# Patient Record
Sex: Female | Born: 1960 | Race: White | Hispanic: No | Smoking: Never smoker
Health system: Southern US, Community
[De-identification: ages and names within clinical notes are randomized; demographics above are authoritative.]

## PROBLEM LIST (undated history)

## (undated) DIAGNOSIS — IMO0002 Reserved for concepts with insufficient information to code with codable children: Secondary | ICD-10-CM

## (undated) DIAGNOSIS — G4730 Sleep apnea, unspecified: Secondary | ICD-10-CM

## (undated) DIAGNOSIS — M069 Rheumatoid arthritis, unspecified: Secondary | ICD-10-CM

## (undated) DIAGNOSIS — F32A Depression, unspecified: Secondary | ICD-10-CM

## (undated) DIAGNOSIS — A630 Anogenital (venereal) warts: Secondary | ICD-10-CM

## (undated) DIAGNOSIS — F329 Major depressive disorder, single episode, unspecified: Secondary | ICD-10-CM

## (undated) DIAGNOSIS — T8859XA Other complications of anesthesia, initial encounter: Secondary | ICD-10-CM

## (undated) DIAGNOSIS — M810 Age-related osteoporosis without current pathological fracture: Secondary | ICD-10-CM

## (undated) DIAGNOSIS — K219 Gastro-esophageal reflux disease without esophagitis: Secondary | ICD-10-CM

## (undated) DIAGNOSIS — K2270 Barrett's esophagus without dysplasia: Secondary | ICD-10-CM

## (undated) DIAGNOSIS — M797 Fibromyalgia: Secondary | ICD-10-CM

## (undated) DIAGNOSIS — F419 Anxiety disorder, unspecified: Secondary | ICD-10-CM

## (undated) DIAGNOSIS — I10 Essential (primary) hypertension: Secondary | ICD-10-CM

## (undated) DIAGNOSIS — T4145XA Adverse effect of unspecified anesthetic, initial encounter: Secondary | ICD-10-CM

## (undated) DIAGNOSIS — K449 Diaphragmatic hernia without obstruction or gangrene: Secondary | ICD-10-CM

## (undated) HISTORY — PX: OTHER SURGICAL HISTORY: SHX169

## (undated) HISTORY — DX: Fibromyalgia: M79.7

## (undated) HISTORY — DX: Gastro-esophageal reflux disease without esophagitis: K21.9

## (undated) HISTORY — DX: Rheumatoid arthritis, unspecified: M06.9

## (undated) HISTORY — DX: Depression, unspecified: F32.A

## (undated) HISTORY — DX: Major depressive disorder, single episode, unspecified: F32.9

## (undated) HISTORY — DX: Diaphragmatic hernia without obstruction or gangrene: K44.9

## (undated) HISTORY — DX: Reserved for concepts with insufficient information to code with codable children: IMO0002

## (undated) HISTORY — DX: Anogenital (venereal) warts: A63.0

## (undated) HISTORY — PX: NASAL SINUS SURGERY: SHX719

## (undated) HISTORY — DX: Essential (primary) hypertension: I10

## (undated) HISTORY — DX: Age-related osteoporosis without current pathological fracture: M81.0

## (undated) HISTORY — DX: Anxiety disorder, unspecified: F41.9

## (undated) HISTORY — PX: COLPOSCOPY: SHX161

---

## 1980-07-08 HISTORY — PX: MANDIBLE SURGERY: SHX707

## 1993-07-08 HISTORY — PX: TOTAL HIP ARTHROPLASTY: SHX124

## 1994-07-08 HISTORY — PX: TOTAL HIP ARTHROPLASTY: SHX124

## 1996-07-08 HISTORY — PX: TOTAL SHOULDER ARTHROPLASTY: SHX126

## 1998-03-24 ENCOUNTER — Other Ambulatory Visit: Admission: RE | Admit: 1998-03-24 | Discharge: 1998-03-24 | Payer: Self-pay | Admitting: Gastroenterology

## 1998-03-28 ENCOUNTER — Ambulatory Visit (HOSPITAL_COMMUNITY): Admission: RE | Admit: 1998-03-28 | Discharge: 1998-03-28 | Payer: Self-pay | Admitting: Obstetrics and Gynecology

## 1998-07-04 ENCOUNTER — Other Ambulatory Visit: Admission: RE | Admit: 1998-07-04 | Discharge: 1998-07-04 | Payer: Self-pay | Admitting: Obstetrics and Gynecology

## 1999-05-25 ENCOUNTER — Other Ambulatory Visit: Admission: RE | Admit: 1999-05-25 | Discharge: 1999-05-25 | Payer: Self-pay | Admitting: *Deleted

## 1999-05-25 ENCOUNTER — Encounter (INDEPENDENT_AMBULATORY_CARE_PROVIDER_SITE_OTHER): Payer: Self-pay | Admitting: Specialist

## 2000-03-04 ENCOUNTER — Other Ambulatory Visit: Admission: RE | Admit: 2000-03-04 | Discharge: 2000-03-04 | Payer: Self-pay | Admitting: Obstetrics and Gynecology

## 2000-05-23 ENCOUNTER — Encounter (INDEPENDENT_AMBULATORY_CARE_PROVIDER_SITE_OTHER): Payer: Self-pay | Admitting: Specialist

## 2000-05-23 ENCOUNTER — Other Ambulatory Visit: Admission: RE | Admit: 2000-05-23 | Discharge: 2000-05-23 | Payer: Self-pay | Admitting: Gastroenterology

## 2000-06-17 ENCOUNTER — Encounter: Payer: Self-pay | Admitting: Orthopedic Surgery

## 2000-06-19 ENCOUNTER — Inpatient Hospital Stay (HOSPITAL_COMMUNITY): Admission: RE | Admit: 2000-06-19 | Discharge: 2000-06-20 | Payer: Self-pay | Admitting: Orthopedic Surgery

## 2001-05-27 ENCOUNTER — Encounter: Payer: Self-pay | Admitting: Obstetrics and Gynecology

## 2001-05-27 ENCOUNTER — Ambulatory Visit (HOSPITAL_COMMUNITY): Admission: RE | Admit: 2001-05-27 | Discharge: 2001-05-27 | Payer: Self-pay | Admitting: Obstetrics and Gynecology

## 2003-07-09 DIAGNOSIS — A630 Anogenital (venereal) warts: Secondary | ICD-10-CM

## 2003-07-09 HISTORY — DX: Anogenital (venereal) warts: A63.0

## 2003-07-19 ENCOUNTER — Other Ambulatory Visit: Admission: RE | Admit: 2003-07-19 | Discharge: 2003-07-19 | Payer: Self-pay | Admitting: Obstetrics and Gynecology

## 2003-11-11 ENCOUNTER — Other Ambulatory Visit: Admission: RE | Admit: 2003-11-11 | Discharge: 2003-11-11 | Payer: Self-pay | Admitting: Obstetrics and Gynecology

## 2004-03-19 ENCOUNTER — Ambulatory Visit (HOSPITAL_COMMUNITY): Admission: RE | Admit: 2004-03-19 | Discharge: 2004-03-19 | Payer: Self-pay | Admitting: Obstetrics and Gynecology

## 2004-05-10 ENCOUNTER — Other Ambulatory Visit: Admission: RE | Admit: 2004-05-10 | Discharge: 2004-05-10 | Payer: Self-pay | Admitting: Obstetrics and Gynecology

## 2004-05-11 ENCOUNTER — Ambulatory Visit: Payer: Self-pay | Admitting: Gastroenterology

## 2004-05-17 ENCOUNTER — Ambulatory Visit: Payer: Self-pay | Admitting: Gastroenterology

## 2004-12-06 ENCOUNTER — Other Ambulatory Visit: Admission: RE | Admit: 2004-12-06 | Discharge: 2004-12-06 | Payer: Self-pay | Admitting: Obstetrics and Gynecology

## 2005-10-22 ENCOUNTER — Ambulatory Visit: Payer: Self-pay | Admitting: Gastroenterology

## 2005-12-10 ENCOUNTER — Ambulatory Visit: Payer: Self-pay | Admitting: Gastroenterology

## 2005-12-17 ENCOUNTER — Ambulatory Visit: Payer: Self-pay | Admitting: Gastroenterology

## 2005-12-17 ENCOUNTER — Encounter (INDEPENDENT_AMBULATORY_CARE_PROVIDER_SITE_OTHER): Payer: Self-pay | Admitting: Specialist

## 2006-07-08 HISTORY — PX: OTHER SURGICAL HISTORY: SHX169

## 2006-08-25 ENCOUNTER — Encounter: Admission: RE | Admit: 2006-08-25 | Discharge: 2006-08-25 | Payer: Self-pay | Admitting: Orthopedic Surgery

## 2006-11-06 ENCOUNTER — Encounter: Admission: RE | Admit: 2006-11-06 | Discharge: 2006-11-06 | Payer: Self-pay | Admitting: Orthopedic Surgery

## 2007-01-28 ENCOUNTER — Inpatient Hospital Stay (HOSPITAL_COMMUNITY): Admission: RE | Admit: 2007-01-28 | Discharge: 2007-01-31 | Payer: Self-pay | Admitting: Orthopedic Surgery

## 2007-02-18 IMAGING — CT CT PELVIS W/O CM
2 of 4 series · 17 of 46 positions shown, 19 images · non-contrast
Comparison: None.

CLINICAL DATA: Status post bilateral hip replacement. Right hip pain. Question
fracture or loosening.
TECHNIQUE: Multidetector CT imaging of the pelvis was performed following the
standard protocol without IV contrast.

[Series 3: boney pelvis · axial · 0.70mm/px · z∈[-362,-62]mm · 14 of 128 slices shown, 16 images]
[im 8/128  soft-tissue]
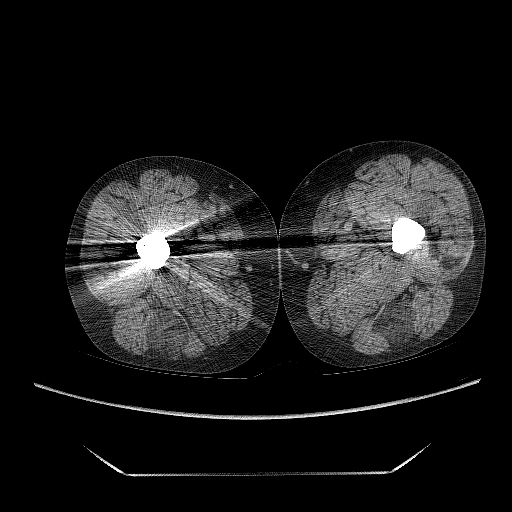
[im 8/128  bone]
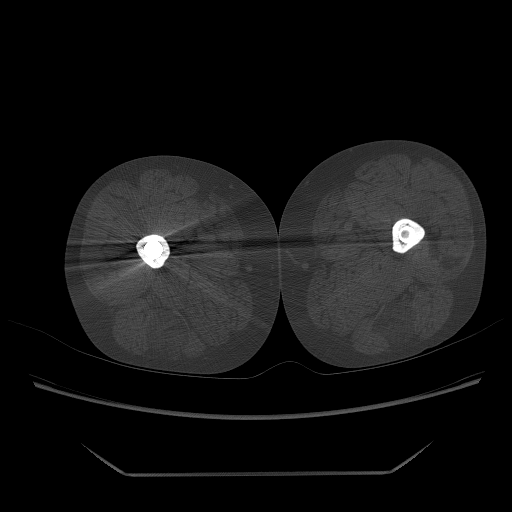
[im 15/128  soft-tissue]
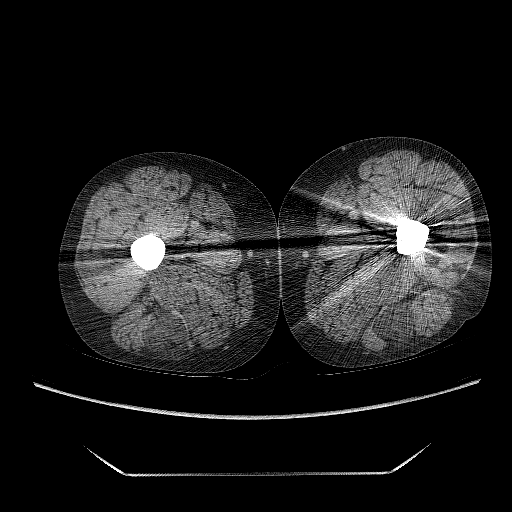
[im 23/128  soft-tissue]
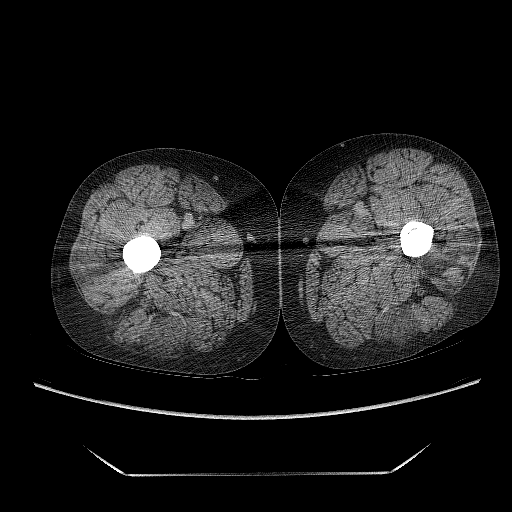
[im 38/128  soft-tissue]
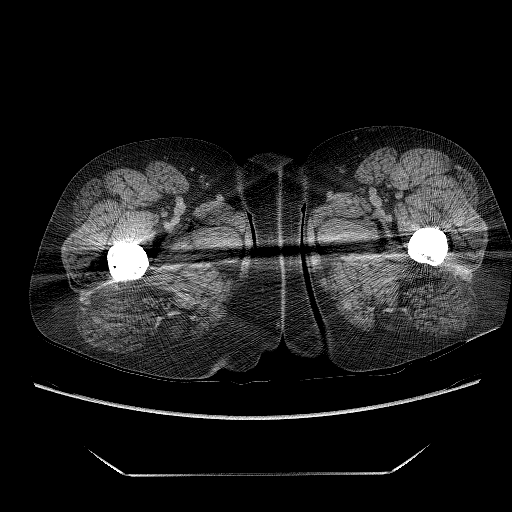
[im 45/128  soft-tissue]
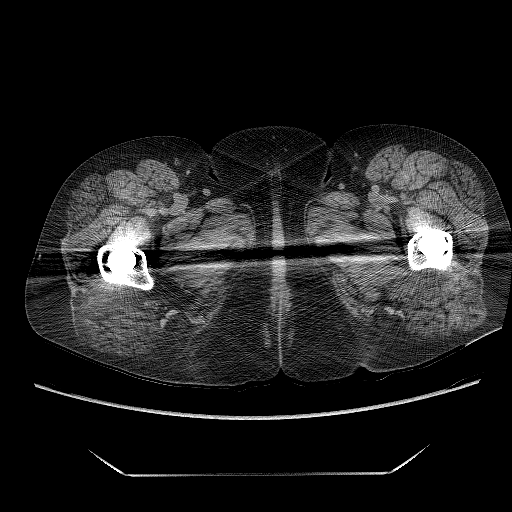
[im 53/128  soft-tissue]
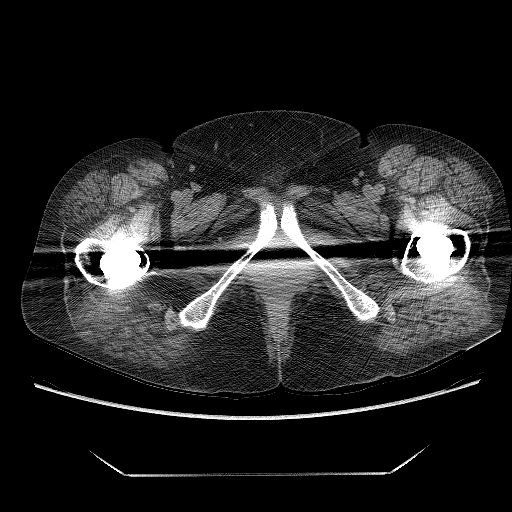
[im 60/128  soft-tissue]
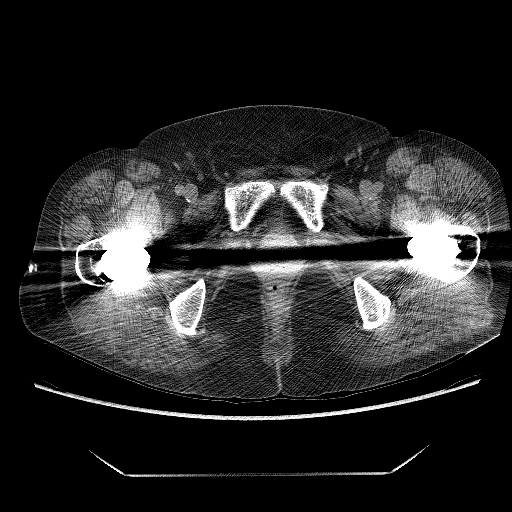
[im 68/128  soft-tissue]
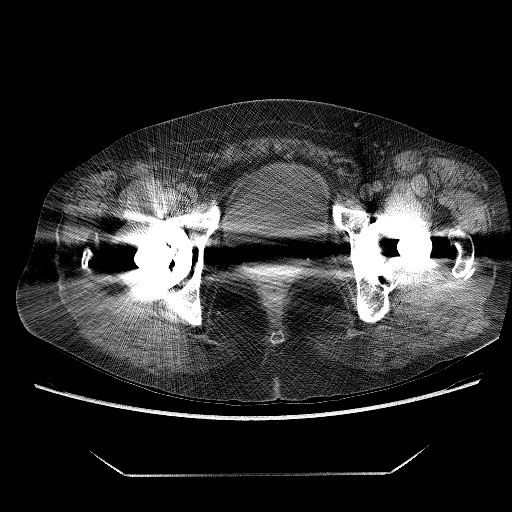
[im 75/128  soft-tissue]
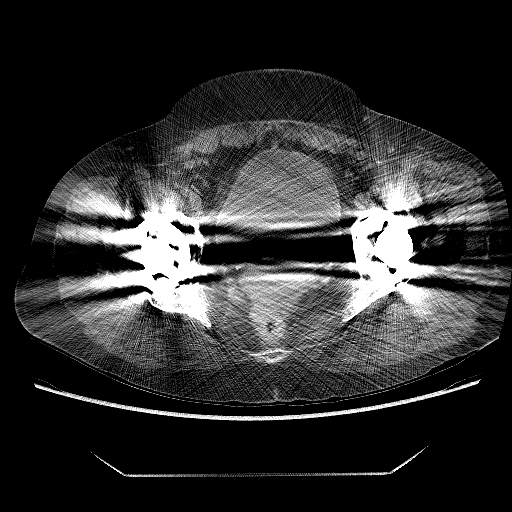
[im 75/128  bone]
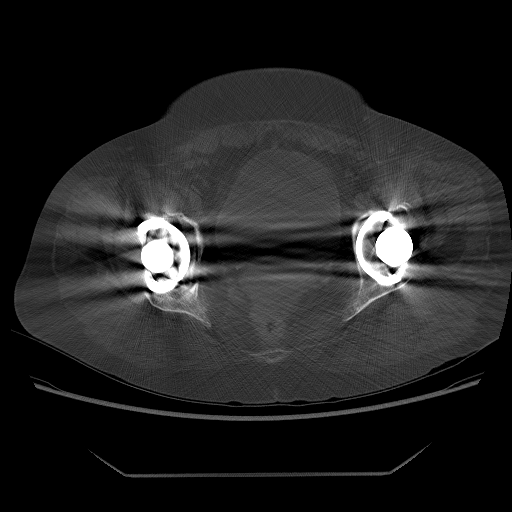
[im 83/128  soft-tissue]
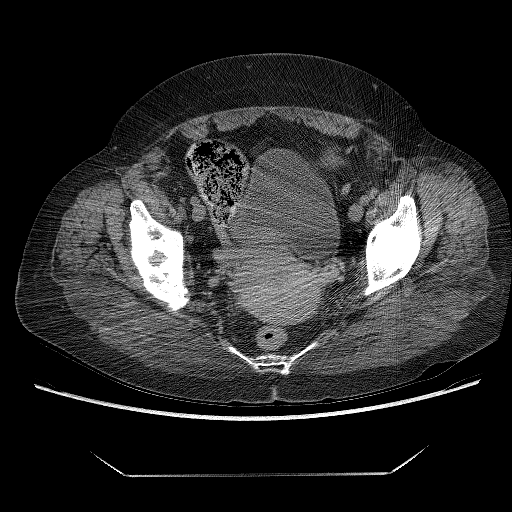
[im 98/128  soft-tissue]
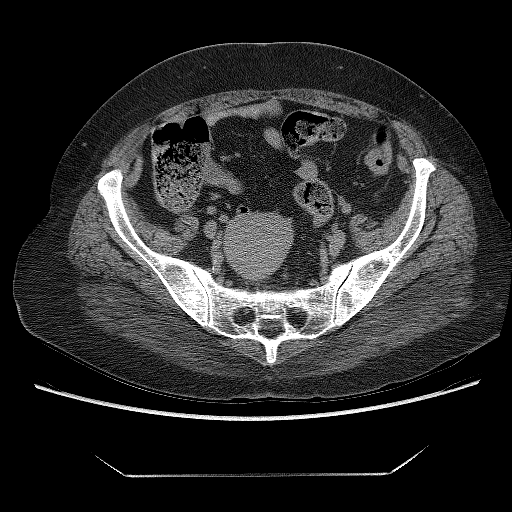
[im 105/128  soft-tissue]
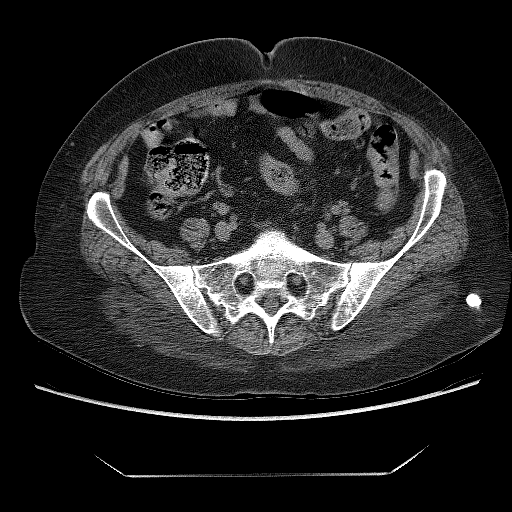
[im 113/128  soft-tissue]
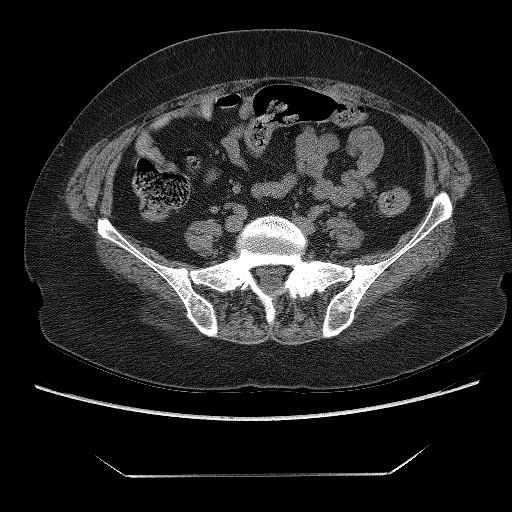
[im 120/128  soft-tissue]
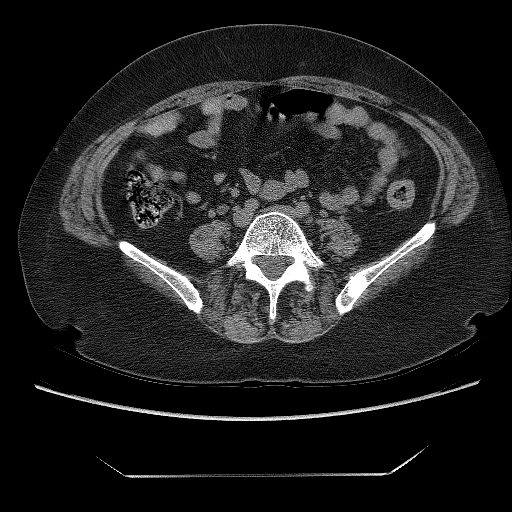

[Series 602: sagittal body · sagittal · 0.70mm/px · 3 of 142 slices shown]
[im 48/142  soft-tissue]
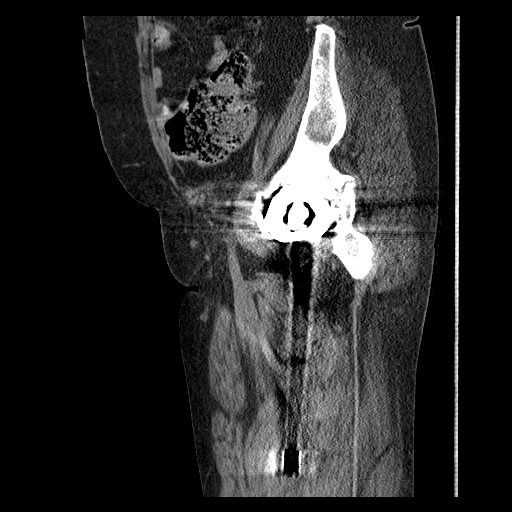
[im 63/142  soft-tissue]
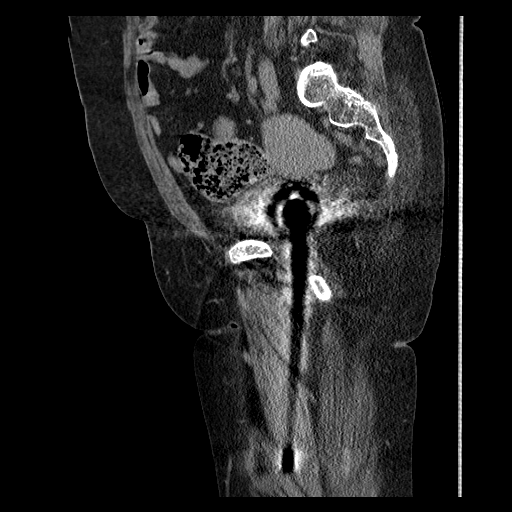
[im 79/142  soft-tissue]
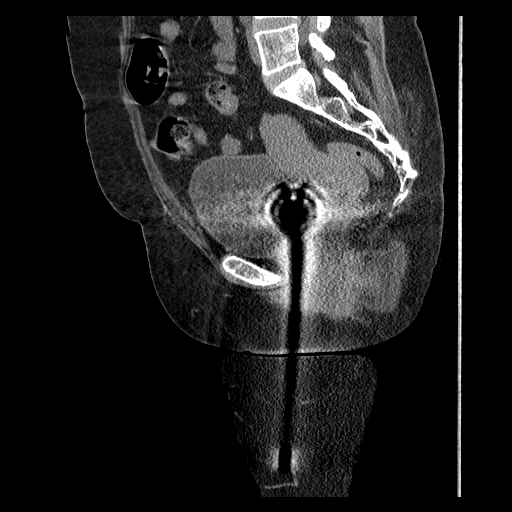

[17 of 46 positions shown; findings below may reference images not displayed]

CT PELVIS WITHOUT CONTRAST:  

1.9 cm well-defined sclerotic focus in the left iliac crest is probably a bone
island, demonstrating no cortical thickening or bony expansion.

The patient has a right hip prosthesis in place. There is trabecular loss and
lucency along the superior margin of the acetabular component, and also along
the antero- inferior aspect of the acetabular cup. Imaging features may be
related to small particle disease or infection. Loosening could also have this
appearance. Although streak artifact from the hardware degrades image quality at
the level of the acetabulum, there is a fracture of the right acetabulum
extending from the posterosuperior acetabulum anteriorly and inferiorly.

No evidence for a periprosthetic fracture involving the femoral component.

The pubic rami are intact. No evidence for sacral insufficiency fracture.
IMPRESSION: There is lucency around the right acetabular cup, mainly antero- inferiorly
although cystic change is also seen in the roof of acetabulum. This appearance
is probably related to small particle disease or loosening although infection
cannot be excluded.

There is an associated fracture of the right acetabulum with some remodeling
along the posterior superior aspect of the fracture line suggesting that this is
nonacute. Fracture line is apparent at the posterior and superior aspect of the
acetabulum, in the superior aspect of the anterior acetabulum, and extending
antero- inferiorly through the innominate bone.

## 2010-07-29 ENCOUNTER — Encounter: Payer: Self-pay | Admitting: Obstetrics and Gynecology

## 2010-11-20 NOTE — H&P (Signed)
NAMEJHOANNA, Jill Rasmussen               ACCOUNT NO.:  [id number]   MEDICAL RECORD NO.:  [id number]          PATIENT TYPE:  INP   LOCATION:  1606                         FACILITY:  [facility]   PHYSICIAN:  Ollen Gross, M.D.    DATE OF BIRTH:  05-04-1961   DATE OF ADMISSION:  01/28/2007  DATE OF DISCHARGE:                              HISTORY & PHYSICAL   CHIEF COMPLAINT:  Right hip pain.   HISTORY OF PRESENT ILLNESS:  The patient is 50 year old female who has  been seen by Dr. Lequita Halt for ongoing hip pain.  She was referred over by  Dr. Simonne Come for painful right total hip.  She had her hip replaced  back in the mid-90s.  Did well up until this past year.  Recently over  the past several months she has developed significant groin pain with  ambulation.  She has been evaluated and discovered that she had a medial  wall acetabular fracture.  She has had a couple of CT scans which showed  some healing of that but also showed loosening behind the in the  acetabular component.  Her pain is with weightbearing.  She has been  seen by Dr. Lequita Halt and felt based on clinical presentation and based on  her x-rays, it appears that she has a loose acetabular component.  It  was felt that she would benefit from undergoing surgical intervention.  The risks and benefits have been discussed, and she elects to proceed  with surgery.   ALLERGIES:  NO KNOWN DRUG ALLERGIES.   CURRENT MEDICATIONS:  Prednisone, Prilosec, Benicar, Lyrica., Effexor  XR, Fosamax, hydrocodone.   PAST MEDICAL HISTORY:  1. Depression.  2. Migraines.  3. History of pneumonia in 1994.  4. Hypertension.  5. Hiatal hernia.  6. Reflux.  7. Osteoporosis.  8. Rheumatoid arthritis.   PAST SURGERIES:  1. History of upper jaw surgery in 1984, lower jaw surgery in 1985.  2. Left hip replaced in 1994.  3. Right knee replaced in 1995.  4. Left shoulder replaced in 1997.  5. Left foot surgery in 2001.   SOCIAL HISTORY:  Divorced.   Teacher.  Nonsmoker.  Social intake of  alcohol.  One daughter.  Friend and daughter will be assisting with care  after surgery.   FAMILY HISTORY:  Arthritis, diabetes.   REVIEW OF SYSTEMS:  GENERAL:  No fevers, chills, night sweats.  NEUROLOGIC:  No seizures, syncope, or paralysis.  RESPIRATORY:  No  shortness of breath, productive cough, or hemoptysis.  CARDIOVASCULAR:  No chest pain, angina, orthopnea.  GI:  No nausea, vomiting, diarrhea,  or constipation.  GU:  No dysuria, hematuria, or discharge.  MUSCULOSKELETAL:  Right hip.   PHYSICAL EXAMINATION:  VITAL SIGNS:  Pulse 78, respirations 14, blood  pressure 124/84.  GENERAL:  50 year old white female, well-nourished, well-developed,  short stature, slightly overweight, alert, oriented, cooperative,  pleasant.  HEENT:  Normocephalic, atraumatic.  Pupils are round and reactive.  Oropharynx clear.  EOMs intact.  NECK:  Supple.  CHEST:  Clear anterior posterior chest walls.  No rhonchi, rales,  wheezing.  HEART:  Regular rate and rhythm.  No murmur.  S1 AND S2.  ABDOMEN:  Soft, slightly round.  Bowel sounds present.  BREASTS/GU:  Not done.  Not pertinent to present illness.  EXTREMITIES:  Right hip shows flexion of 100 degrees, internal rotation  20, external rotation 25, abduction 30.   IMPRESSION:  1. Loose acetabular component, right total hip.  2. Migraines.  3. Depression.  4. History of pneumonia in 1994.  5. Hypertension.  6. Hiatal hernia.  7. Reflux disease.  8. Osteoporosis.  9. Rheumatoid arthritis.   PLAN:  The patient will be admitted to [facility] to undergo a  right acetabular revision versus total hip arthroplasty revision.  Surgery will be performed by Dr. Ollen Gross.      Alexzandrew L. Perkins, P.A.C.      Ollen Gross, M.D.  Electronically Signed    ALP/MEDQ  D:  01/29/2007  T:  01/30/2007  Job:  [id number]   cc:   Areatha Keas, M.D.  Fax: [phone]   Ollen Gross, M.D.  Fax:  [phone]

## 2010-11-20 NOTE — Op Note (Signed)
NAMEKEITA, DEMARCO               ACCOUNT NO.:  [id number]   MEDICAL RECORD NO.:  [id number]          PATIENT TYPE:  INP   LOCATION:  1606                         [facility]ACILITY:  [facility]   PHYSICIAN:  Ollen Gross, M.D.    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   PROCEDURE:  Right acetabular revision.   SURGEON:  Ollen Gross, M.D.   ASSISTANT:  Alexzandrew L. Perkins, P.A.C.   ANESTHESIA:  Spinal.   ESTIMATED BLOOD LOSS:  200 mL.   DRAINS:  None.   COMPLICATIONS:  None.   CONDITION:  Stable to recovery.   CLINICAL NOTE:  Jill Rasmussen is a 50 year old female with right total hip  arthroplasty done many years ago by Dr. Simonne Come.  She has done well  until the past year or so when she has had progressively worsening groin  pain.  She had a medial wall acetabular fracture at some point.  The  bone scan was consistent with activity around the acetabular component.  Overall, clinically, it appears that the acetabular component has  loosened.  She presents now for acetabular revision.   PROCEDURE IN DETAIL:  After successful administration of spinal  anesthetic, the patient is placed in the left lateral decubitus position  with the right side up and held with the hip positioner.  The right  lower extremity was isolated from her perineum with plastic drapes and  prepped and draped in the usual sterile fashion.  The proximal half of  her previous incisions were utilized, the skin cut with a 10 blade  through subcutaneous tissue, to the fascia lata which was incised in  line with the skin incision.  The sciatic nerve was palpated and  protected.  The posterior pseudocapsule was excised off the femur.  The  fibrinous scar  tissue was removed.  There was fibrinous debris  consistent with osteolytic debris in the joint.  No fluid was  encountered.  I removed the pseudomembrane and the hip was easily  dislocated.  She had a ceramic head which is removed.  I removed some of  the anterior scar and was able to translate the femur anteriorly and get  excellent acetabular exposure.  The component had an unusual appearance  in that the elevated liner was in the anterior inferior position which  we would not expect it to be with the initial prosthesis.  When I went  to remove the liner, entire cup was loose and the cup and liner came out  in one unit.  This was is the Dual Geometry cup which had no porous  coating and had HA coating initially.  There was absolutely no ingrowth.  The cup had loosened and then spun out inferiorly.  The anterior and  posterior columns were intact.  Medially, the bone was thin but intact.  I removed all the fibrinous membrane from  the base of the acetabulum to  get down to bone.  The component was a 52 mm component.  I began reaming  at 49 mm and reamed all the way up to 59 mm in order to get a good rim  fit.  A 16 mm Pinnacle multi-fold acetabular component is impacted into  the acetabulum with good purchase and then is transfixed with an  additional four dome screws with excellent purchase.  I reattached the  impactor handle to the cup and moved the cup around and cup and pelvis  were moving as a single unit consistent with a stable implant.  We  placed the trial 36 mm neutral +4 liner.  The Osteonics stem was in  excellent position and was well fixed.  I tried a 36 +0 head first and  there was a little too much soft tissue laxity, so I put a 36 +5 which  had excellent soft tissue tension.  The stability was outstanding with  full extension, full external rotation, 70 degrees flexion, 40 degrees  adduction, 90 degrees internal rotation, 90 degrees of flexion, and  about 70 degrees of  internal rotation.  I placed the right leg on top of  the left, it felt as though the leg lengths were equal.  We then removed  the trial head and liner and placed the permanent 36 mm neutral +4  Marathon liner into the acetabular component and then placed the 36 +5  femoral head.  This was a C taper Osteonics femoral head.  The  acetabular component is a Pinnacle acetabular component from [employer].  Once the hip was reduced, there was outstanding stability, again.  The  wound was then copiously irrigated with saline solution and the  posterior tissues reattached to the femur through drill holes with  Ethibond suture.  The fascia lata was closed with interrupted #1 Vicryl,  subcu closed with #1 and 2-0 Vicryl, and the skin closed with staples.  The incision was cleaned and dried.  A bulky sterile dressing was  applied.  The leg was placed into a knee immobilizer and she was  awakened and transferred to recovery in stable condition.      Ollen Gross, M.D.  Electronically Signed     FA/MEDQ  D:  01/28/2007  T:  01/28/2007  Job:  [id number]

## 2010-11-23 NOTE — Discharge Summary (Signed)
[facility]  Patient:    Jill Rasmussen, Jill Rasmussen                     MRN: [id number] Adm. Date:  [id number] Disc. Date: [id number] Attending:  Marlowe Kays Page Dictator:   Della Goo, P.A.                           Discharge Summary  ADMITTING DIAGNOSES: 1. Rheumatoid arthritis multiple deformities of the left foot. 2. History of hiatal hernia and gastroesophageal reflux disease.  DISCHARGE DIAGNOSES: 1. Rheumatoid arthritis multiple deformities of the left foot. 2. History of hiatal hernia and gastroesophageal reflux disease.  PROCEDURE:  The patient underwent a bunionectomy fusion metatarsal phalangeal joint of the great toe on the left as well as fusion proximal interphalangeal joint second toe. Resection of metatarsal heads 2-5 with pin fixation of the left foot was also performed by Dr. Simonne Come assisted by Maud Deed, P.A.-C. under general anesthesia.  CONSULTATIONS:  None.  BRIEF HISTORY:  Jill Rasmussen is a 50 year old white female with known rheumatoid arthritis. She has significant bunion deformity with severe valgus of the great toe associated with clawing of the second and third toes and slightly of the fifth toe with prominent painful metatarsal heads and callouses. It was felt she would require surgical intervention and was admitted for the procedure as stated above.  BRIEF HOSPITAL COURSE:  The patient tolerated the procedure without difficulty. Unfortunately, postoperatively she had significant discomfort and she also had known sensitivity to morphine and Demerol both of which cause her severe nausea and vomiting postoperatively. She eventually was placed on a Dilaudid PCA pump and Phenergan scheduled and for the first 24 hours did have better pain control. Eventually, she did become quite nauseated and had nausea and vomiting throughout the entire second postoperative day. Dilaudid was discontinued and she was started on  Toradol for pain control. She was also given IV Reglan and Phenergan for nausea. Her IV rate was increased and she was changed to clear liquids until the nausea resolved. The patient received a perioperative dose of steroids as she is a chronic steroid user. She developed hypokalemia at 2.7 on December 13 and was treated with K-Dur. Prior to discharge, her hypokalemia resolved. The patient had a physical therapy consult for ambulation and gait training and was allowed touchdown weightbearing only on the left lower extremity. She utilized crutches for ambulation without difficulty. The dressing was found to be intact. Neuromuscular motor function in the lower extremities was intact. On June 20, 2000, the patient was felt to be stable for discharge to her home.  PERTINENT LABORATORY DATA:  As stated above, the patient had hypokalemia which resolved. The patient also had very mild hyponatremia which resolved. Hemoglobin and hematocrit on June 17, 2000 showed values of 12.6 and 36.3 respectively. No chest x-ray or EKG is on the chart for this dictation.  PLAN:  The patient is discharged to home. She is advised to continue strict elevation of the foot when not ambulating. She will be touchdown weightbearing utilizing crutches. The patient will keep her dressing/splint dry and clean at all times. She will use over the counter laxatives or stool softener as needed as long as she is on analgesics. The patient will follow-up with Dr. Simonne Come 10-14 days following her surgery. Prescriptions at discharge include Percocet 5 mg #40, 1-2 every 4-6h as needed for pain, Robaxin 500 mg #30, 1  every 8h as needed for spasm, Phenergan 25 mg #10, 1 every 6h as needed for nausea and vomiting. The patient has been advised to call the office if she has further questions or concerns prior to her return office visit. DD:  06/20/00 TD:  06/20/00 Job: [id number]

## 2010-11-23 NOTE — Discharge Summary (Signed)
NAMEENZLEY, KITCHENS               ACCOUNT NO.:  [id number]   MEDICAL RECORD NO.:  [id number]          PATIENT TYPE:  INP   LOCATION:  1606                         FACILITY:  [facility]   PHYSICIAN:  Ollen Gross, M.D.    DATE OF BIRTH:  07-Jun-1961   DATE OF ADMISSION:  01/28/2007  DATE OF DISCHARGE:  01/31/2007                               DISCHARGE SUMMARY   ADMITTING DIAGNOSES:  1. Loose acetabular component, right total hip.  2. Migraines.  3. Depression.  4. History of pneumonia in 1994.  5. Hypertension.  6. Hiatal hernia.  7. Reflux disease.  8. Osteoporosis.  9. Rheumatoid arthritis.   DISCHARGE DIAGNOSES:  1. Failed acetabular component, right total hip, status post right      acetabular revision.  2. Mild postop blood loss anemia.  3. Migraines.  4. Depression.  5. History of pneumonia in 1994.  6. Hypertension.  7. Hiatal hernia.  8. Reflux disease.  9. Osteoporosis.  10.Rheumatoid arthritis.   PROCEDURE:  January 28, 2007, right acetabular revision.  Surgeon Dr.  Lequita Halt.  Assistant Avel Peace, PA-C.  Spinal anesthesia.   CONSULTS:  None.   BRIEF HISTORY:  Jill Rasmussen is a 50 year old female with a right total hip  done many years ago by Dr. Simonne Come.  Did well until about a year or so  ago.  Had progressive worsening groin pain and a median wall acetabular  fracture.  Bone scan showed some activity consistent around the  acetabular component.  It appeared that it had loosened up.  Now  presents for the revision surgery.   LABORATORY DATA:  Preop CBC showed a hemoglobin of 12.3, hematocrit of  36.7, white cell count 8.7.  Postop hemoglobin 9.9, drifted down to 9.4,  back up to 9.7 and 27.8 crit.  PT/PTT preop 12.6 and 27 respectively.  INR 0.9.  Serial protimes __________ PT/INR of 13.3 and 2.8.  Chem panel  on admission all within normal limits with exception of minimally low  total bili of 0.2.  Serial BMETs were followed.  Electrolytes remained  within normal  limits.  Urine pregnancy preop negative.  Preop UA  negative.  Blood group type O+.  EKG January 23, 2007, normal sinus rhythm,  normal EKG, no previous tracings, confirmed by Dr. Juanito Doom.  Two-view  chest January 23, 2007, no acute cardiopulmonary disease.  Right hip films  preop:  Loosening of acetabular component, right total hip, with  fracture median wall of the right acetabular.  Finding consistent with  wear as the acetabular component, left total hip.  Portable pelvis:  Internal revision of right total hip acetabular component without  complicating features, expected postsurgical appearance.   HOSPITAL COURSE:  The patient admitted to [facility],  tolerated procedure well.  Later sent to the recovery room, orthopedic  floor.  Started on PCA and p.o. analgesics for pain control following  surgery.  Actually did well through the morning of day one.  On the  evening of surgery, in the morning of day one, a little asymptomatic  hypotension noted.  She was preop at  124 systolic, was down to 94, so we  discontinued the PCA.  It was felt to be due to narcotics and  postsurgical.  She had excellent urinary output post surgery on day one.  Dressing looked okay.  Hemoglobin was down to 9.9.  By day two,  hemoglobin was a little bit lower at 9.4.  She was actually feeling  better.  Started getting up with her physical therapy and walked about  35 feet.  She was doing very well, having undergone acetabular revision.  The INR had come back up and she was already therapeutic at 2.5.  She  continued to progress well.  Hemoglobin was back up to 9.7, on the  increase.  She was tolerating her meds, ambulating well, and was ready  go home by the day of January 31, 2007.   DISCHARGE/PLAN:  1. The patient discharged home on January 31, 2007.  2. Discharge diagnoses, please see above.  3. Discharge meds:  Coumadin, Percocet, Robaxin.  4. Activity:  She is partial weightbearing, 25-50% right lower       extremity, hip precautions, total hip protocol.  Home on PT and      home health nursing.  5. Follow-up 2 weeks.  6. Diet:  Regular diet.   DISPOSITION:  Home.   CONDITION ON DISCHARGE:  Improving.      Alexzandrew L. Perkins, P.A.C.      Ollen Gross, M.D.  Electronically Signed    ALP/MEDQ  D:  02/05/2007  T:  02/06/2007  Job:  [id number]   cc:   Areatha Keas, M.D.  Fax: [phone]

## 2010-11-23 NOTE — Op Note (Signed)
[facility]  Patient:    Jill Rasmussen, Jill Rasmussen                     MRN: [id number] Proc. Date: 06/17/00 Adm. Date:  [id number] Attending:  Marlowe Kays Page                           Operative Report  PREOPERATIVE DIAGNOSIS:  Rheumatoid arthritis with multiple deformities left foot.  POSTOPERATIVE DIAGNOSIS: Rheumatoid arthritis with multiple deformities left foot.  OPERATION: 1. Bunionectomy and fusion metatarsal phalangeal joint great toe. 2. Fusion proximal interphalangeal joint second toe. 3. Resection of metatarsal heads two through five with pin fixation, all left    foot.  SURGEON:  Illene Labrador. [place], M.D.  ASSISTANT:  Della Goo, P.A.  ANESTHESIA:  General.  JUSTIFICATION FOR PROCEDURE:  She had significant bunion deformity with severe valgus of the great toe associated with clawing of the second and thirds toes and slightly of the fifth toe and prominent painful metatarsal heads and calluses.  PROCEDURE:  Prophylactic antibiotics, satisfactory ______ anesthesia, pneumatic tourniquet ______ the foot and ankle was draped in a sterile field. I made incisions between the second and third and between the fourth and fifth metatarsals and in each case, was able to isolate the metatarsal head and neck and make an oblique cut lateral-ward so that it was longer medially and removed the metatarsal head.  The third and fifth toes, I was able to correct the PIP deformities with manual ______ but I could not in the second and consequently extended my incision between the second and third metatarsals over the outer side of the PIP joint of the second toe and after splitting the extensor mechanism resected the ends of the distal portion of the proximal phalanx and the proximal portion of the middle phalanx with the microsaw so that they were not as a nice squared off fashion.  I then made a dorsomedial typical bunion incision, but extended it  more distally over the proximal phalanx of the great toe.  The capsule was opened in line with skin incision and a generous bunionectomy made with osteotome and rongeur.  I denuded what was left of the articular cartilage of the first metatarsal head and made a receptacle at the base of the proximal phalanx with a combination of curet and the Mariam cone.  I then used the other mate to the Mariam cone to cone down the first metatarsal head.  I then used the C-arm and protractor to stabilize the first metatarsal phalangeal joint in about 15 degrees of valgus and about 25 degrees of dorsiflexion with two 0.045 smooth K-wires with position confirmed with the C-arm.  After this joint had been stabilized and I felt comfortable with the position, I used a 2.5 drill going through the medial flare of the proximal phalanx of the great toe into the metatarsal head and used the 4.0/35 mm cancellous screw stabilizing the fusion.  Final confirmatory pictures were taken with the C-arm.  I then placed some cancellous bone graft around the fusion site and after irrigating the wound well with antibiotic solution, then closed the capsule of the bunion with 2-0 Vicryl and skin with 4-0 nylon.  The metatarsals two through five and adjacent toes were stabilized with pin fixation.  I then released the placed retrograde.  I then released the tourniquet and made sure that all toes were nice and pink.  The subcutaneous tissue of the two incisions were closed with interrupted 3-0 Vicryl and skin with interrupted simple and mattress 4-0 nylon.  Betadine, Adaptic, dry sterile dressing were applied after the pins were bent, cut and pin protectors placed.  She tolerated the procedure well at the time of this dictation, she was on her way to recovery in satisfactory condition with no known complications. DD:  06/17/00 TD:  06/17/00 Job: [id number]

## 2011-04-22 LAB — PROTIME-INR
INR: 1.2
INR: 2.8 — ABNORMAL HIGH
Prothrombin Time: 12.6
Prothrombin Time: 28.4 — ABNORMAL HIGH
Prothrombin Time: 31.3 — ABNORMAL HIGH

## 2011-04-22 LAB — COMPREHENSIVE METABOLIC PANEL
BUN: 7
CO2: 28
Calcium: 9.6
Creatinine, Ser: 0.62
GFR calc non Af Amer: >60
Glucose, Bld: 96
Total Bilirubin: 0.2 — ABNORMAL LOW

## 2011-04-22 LAB — BASIC METABOLIC PANEL
BUN: 4 — ABNORMAL LOW
CO2: 25
Calcium: 7.9 — ABNORMAL LOW
Chloride: 105
Chloride: 105
Creatinine, Ser: 0.48
Creatinine, Ser: 0.63
Glucose, Bld: 121 — ABNORMAL HIGH
Glucose, Bld: 176 — ABNORMAL HIGH
Potassium: 3.6
Sodium: 137

## 2011-04-22 LAB — CBC
HCT: 26.7 — ABNORMAL LOW
HCT: 36.7
Hemoglobin: 12.3
Hemoglobin: 9.9 — ABNORMAL LOW
MCHC: 33.5
MCHC: 34.6
MCHC: 34.9
MCHC: 35.2
MCV: 90
MCV: 90.6
MCV: 91
MCV: 91.3
Platelets: 236
Platelets: 265
RBC: 3.09 — ABNORMAL LOW
RBC: 3.17 — ABNORMAL LOW
RBC: 4.02
RDW: 13.1
RDW: 13.2
RDW: 13.3
WBC: 13.1 — ABNORMAL HIGH

## 2011-04-22 LAB — CROSSMATCH: ABO/RH(D): O POS

## 2011-04-22 LAB — APTT: aPTT: 27

## 2011-04-22 LAB — URINALYSIS, ROUTINE W REFLEX MICROSCOPIC
Bilirubin Urine: NEGATIVE
Glucose, UA: NEGATIVE
Hgb urine dipstick: NEGATIVE
Ketones, ur: NEGATIVE
Nitrite: NEGATIVE
pH: 6

## 2012-06-29 ENCOUNTER — Other Ambulatory Visit: Payer: Self-pay | Admitting: Obstetrics and Gynecology

## 2012-06-29 DIAGNOSIS — Z1231 Encounter for screening mammogram for malignant neoplasm of breast: Secondary | ICD-10-CM

## 2012-07-15 ENCOUNTER — Ambulatory Visit (HOSPITAL_COMMUNITY): Payer: Self-pay | Attending: Obstetrics and Gynecology

## 2012-10-26 ENCOUNTER — Encounter: Payer: Self-pay | Admitting: *Deleted

## 2012-11-04 ENCOUNTER — Telehealth: Payer: Self-pay | Admitting: Obstetrics and Gynecology

## 2012-11-04 NOTE — Telephone Encounter (Signed)
Pt calling to get refill on her estrogen patch and progesterone sent to [facility] at [phone]. Scheduled aex for 5/12 with dr Edward Jolly. Would like enough sent until she can come in because she is out.

## 2012-11-16 ENCOUNTER — Encounter: Payer: Self-pay | Admitting: Obstetrics and Gynecology

## 2012-11-16 ENCOUNTER — Ambulatory Visit: Payer: Self-pay | Admitting: Obstetrics and Gynecology

## 2012-12-04 ENCOUNTER — Encounter: Payer: Self-pay | Admitting: Gynecology

## 2012-12-04 ENCOUNTER — Ambulatory Visit: Payer: Self-pay | Admitting: Gynecology

## 2012-12-04 DIAGNOSIS — Z01419 Encounter for gynecological examination (general) (routine) without abnormal findings: Secondary | ICD-10-CM

## 2013-12-24 ENCOUNTER — Encounter: Payer: Self-pay | Admitting: Gastroenterology

## 2014-02-15 ENCOUNTER — Encounter (HOSPITAL_COMMUNITY): Payer: Self-pay | Admitting: Pharmacy Technician

## 2014-02-21 ENCOUNTER — Encounter (HOSPITAL_COMMUNITY): Payer: Self-pay | Admitting: *Deleted

## 2014-03-02 ENCOUNTER — Other Ambulatory Visit: Payer: Self-pay | Admitting: Gastroenterology

## 2014-03-03 ENCOUNTER — Encounter (HOSPITAL_COMMUNITY): Payer: Self-pay | Admitting: *Deleted

## 2014-03-03 ENCOUNTER — Encounter (HOSPITAL_COMMUNITY)
Admission: RE | Disposition: A | Discharge status: Home / Self Care | Payer: Self-pay | Source: Ambulatory Visit | Attending: Gastroenterology

## 2014-03-03 ENCOUNTER — Ambulatory Visit (HOSPITAL_COMMUNITY): Payer: 59 | Admitting: Anesthesiology

## 2014-03-03 ENCOUNTER — Encounter (HOSPITAL_COMMUNITY): Payer: 59 | Admitting: Anesthesiology

## 2014-03-03 ENCOUNTER — Ambulatory Visit (HOSPITAL_COMMUNITY)
Admission: RE | Admit: 2014-03-03 | Discharge: 2014-03-03 | Disposition: A | Discharge status: Home / Self Care | Payer: 59 | Source: Ambulatory Visit | Attending: Gastroenterology | Admitting: Gastroenterology

## 2014-03-03 DIAGNOSIS — F341 Dysthymic disorder: Secondary | ICD-10-CM | POA: Insufficient documentation

## 2014-03-03 DIAGNOSIS — Z1211 Encounter for screening for malignant neoplasm of colon: Secondary | ICD-10-CM | POA: Insufficient documentation

## 2014-03-03 DIAGNOSIS — D131 Benign neoplasm of stomach: Secondary | ICD-10-CM | POA: Diagnosis not present

## 2014-03-03 DIAGNOSIS — IMO0001 Reserved for inherently not codable concepts without codable children: Secondary | ICD-10-CM | POA: Diagnosis not present

## 2014-03-03 DIAGNOSIS — K449 Diaphragmatic hernia without obstruction or gangrene: Secondary | ICD-10-CM | POA: Diagnosis not present

## 2014-03-03 DIAGNOSIS — K219 Gastro-esophageal reflux disease without esophagitis: Secondary | ICD-10-CM | POA: Diagnosis not present

## 2014-03-03 DIAGNOSIS — M810 Age-related osteoporosis without current pathological fracture: Secondary | ICD-10-CM | POA: Diagnosis not present

## 2014-03-03 DIAGNOSIS — K5730 Diverticulosis of large intestine without perforation or abscess without bleeding: Secondary | ICD-10-CM | POA: Diagnosis not present

## 2014-03-03 DIAGNOSIS — D126 Benign neoplasm of colon, unspecified: Secondary | ICD-10-CM | POA: Insufficient documentation

## 2014-03-03 DIAGNOSIS — M069 Rheumatoid arthritis, unspecified: Secondary | ICD-10-CM | POA: Diagnosis not present

## 2014-03-03 DIAGNOSIS — I10 Essential (primary) hypertension: Secondary | ICD-10-CM | POA: Insufficient documentation

## 2014-03-03 DIAGNOSIS — Z79899 Other long term (current) drug therapy: Secondary | ICD-10-CM | POA: Diagnosis not present

## 2014-03-03 DIAGNOSIS — Z96649 Presence of unspecified artificial hip joint: Secondary | ICD-10-CM | POA: Diagnosis not present

## 2014-03-03 DIAGNOSIS — G4730 Sleep apnea, unspecified: Secondary | ICD-10-CM | POA: Diagnosis not present

## 2014-03-03 HISTORY — PX: ESOPHAGOGASTRODUODENOSCOPY (EGD) WITH PROPOFOL: SHX5813

## 2014-03-03 HISTORY — DX: Adverse effect of unspecified anesthetic, initial encounter: T41.45XA

## 2014-03-03 HISTORY — PX: COLONOSCOPY WITH PROPOFOL: SHX5780

## 2014-03-03 HISTORY — DX: Other complications of anesthesia, initial encounter: T88.59XA

## 2014-03-03 HISTORY — DX: Sleep apnea, unspecified: G47.30

## 2014-03-03 HISTORY — DX: Barrett's esophagus without dysplasia: K22.70

## 2014-03-03 SURGERY — ESOPHAGOGASTRODUODENOSCOPY (EGD) WITH PROPOFOL
Anesthesia: Monitor Anesthesia Care

## 2014-03-03 MED ORDER — PROPOFOL 10 MG/ML IV BOLUS
INTRAVENOUS | Status: AC
Start: 2014-03-03 — End: 2014-03-03
  Filled 2014-03-03: qty 20

## 2014-03-03 MED ORDER — SODIUM CHLORIDE 0.9 % IV SOLN: INTRAVENOUS | Status: DC

## 2014-03-03 MED ORDER — PROPOFOL 10 MG/ML IV BOLUS
INTRAVENOUS | Status: AC
  Filled 2014-03-03: qty 20

## 2014-03-03 MED ORDER — LACTATED RINGERS IV SOLN
INTRAVENOUS | Status: DC | PRN
  Administered 2014-03-03: 08:00:00 via INTRAVENOUS

## 2014-03-03 MED ORDER — PROPOFOL INFUSION 10 MG/ML OPTIME
INTRAVENOUS | Status: DC | PRN
  Administered 2014-03-03: 300 ug/kg/min via INTRAVENOUS

## 2014-03-03 SURGICAL SUPPLY — 24 items

## 2014-03-03 NOTE — Op Note (Signed)
[place], [address]   OPERATIVE PROCEDURE REPORT  PATIENT :Jill, Rasmussen  MR#: [id number] BIRTHDATE :Oct 15, 1960 GENDER: Female ENDOSCOPIST: Edmonia James, MD ASSISTANT:   Jiles Harold, technician Luanne Bras, RN CGRN PROCEDURE DATE: 2014-03-22 PRE-PROCEDURE PREPERATION: Patient fasted for 4 hours prior to procedure. PRE-PROCEDURE PHYSICAL: Chest clear to auscultation.  S1 and S2 regular.  Abdomen soft, non-distended, non-tender with NABS. PROCEDURE:     EGD with cold biopsies x 2. ASA CLASS:     Class IV INDICATIONS:     1) Barrett's screening 2) GERD. MEDICATIONS:     Propofol (Diprivan) 300mg  IV TOPICAL ANESTHETIC:   none used.  DESCRIPTION OF PROCEDURE: After the risks benefits and alternatives of the procedure were thoroughly explained, informed consent was obtained. The Pentax Gastroscope Peds [id number]  was introduced through the mouth and advanced to the second portion of the duodenum , without limitations. The instrument was slowly withdrawn as the mucosa was fully examined.   The esophagus and the GEJ were widely patent with no evidence of Barrett's mucosa. There were a few scattered gastric polyps noted in the cardia-2 of these were biopsied for pathology. A medium sized hiatal hernia was noted on retroflexion. The rest of the stomach and the proximal small bowel appeared normal. There were no ulcers, erosions or masses noted. The scope was then withdrawn from the patient and the procedure terminated. The patient tolerated the procedure without immediate complications.  IMPRESSION:  1) No evidence of Barrett's esophagus. 2) Few scattered gastric polyps-biopsied. 2) Medium sized hiatal hernia. 4) Normal proximal small bowel.  RECOMMENDATIONS:     1.  Anti-reflux regimen to be followed. 2.  Avoid NSAIDS for 4 weeks 3.  Await pathology results. 4.  Continue current medications. 5.  OP follow-up is advised on a  PRN basis.  REPEAT EXAM:  No recall planned for now.  DISCHARGE INSTRUCTIONS: standard discharge instructions given. _______________________________ eSigned:  Dr. Edmonia James, MD 03/22/2014 9:59 AM   CPT CODES:     [phone], EGD with biopsies  DIAGNOSIS CODES:     530.85 Barrett's Esophagus 530.81 GERD   CC: Thressa Sheller, M.D.  PATIENT NAME:  Jill, Rasmussen MR#: [id number]

## 2014-03-03 NOTE — Op Note (Signed)
[place], [address]   OPERATIVE PROCEDURE REPORT  PATIENT: Jill Rasmussen, Jill Rasmussen  MR#: [id number] BIRTHDATE: 1961/03/03 GENDER: Female ENDOSCOPIST: Edmonia James, MD ASSISTANT:   Jiles Harold, technician Luanne Bras, RN CGRN PROCEDURE DATE: 03/03/2014 PRE-PROCEDURE PREPARATION: The patient was prepped with a gallon of Golytely the night prior to the procedure.  The patient was fasted for 8 hours prior to the procedure.  PRE-PROCEDURE PHYSICAL: Patient has stable vital signs.  Neck is supple.  There is no JVD, thyromegaly or LAD.  Chest clear to auscultation.  S1 and S2 regular.  Abdomen soft, non-distended, non-tender with NABS. PROCEDURE:     Colonoscopy with cold biopsies x 2 and hot snare polypectomy x 4 and application of a resolution clip to control bleeding. ASA CLASS:     Class IV INDICATIONS:     1.  Colorectal cancer screening. MEDICATIONS:     propofol (Diprivan) 300mg  IV  DESCRIPTION OF PROCEDURE: After the risks, benefits, and alternatives of the procedure were thoroughly explained [including a 10% missed rate of cancer and polyps], informed consent was obtained.  Digital rectal exam was performed.  The Pentax Colonoscope [id number]  was introduced through the anus  and advanced to the cecum, which was identified by both the appendix and ileocecal valve , limited by No adverse events experienced.   The quality of the prep was fair at best with large amount of residual stool in the colon . Multiple washes were done. Small lesions could be missed. The instrument was then slowly withdrawn as the colon was fully examined.     COLON FINDINGS: A 7 mm sessile polyp was found in the proximal descending colon at 80 cm and was removed by a hot snare x 1-200/200. Two 5 mm sessile  polyps were found at the cecum and were removed by hot snare x 2-150/15. The resection was complete and the polyp tissue was completely retrieved.  There were a few scattered diverticula noted throughout the colon. Another 5 mm sessile polyp was found in the distal right colon and was removed by a hot snare x 07-1548/15-there was bleeding noted from the polypectomy site and 1 resolution clip was placed to achieve hemostasis. Few scattered eroison swere noted in the recum and were biopsied for pathology. The rest of the colonic mucosa appeared healthy with a normal vascular pattern.  No masses or AVMs were noted.  The appendiceal orifice and the ICV were identified and photographed.  The terminal ileum appeared normal.  Retroflexed views revealed no abnormalities. The patient tolerated the procedure without immediate complications. The scope was then withdrawn from the patient and the procedure terminated.  TIME TO CECUM:   09 minutes 00 seconds WITHDRAW TIME:  26 minutes 00 seconds  IMPRESSION:     1.  Multiple colonic polyps removed-see above paragraph for details. 2.  Scattered colonic diverticulosis. 3.  Post-polypectomy bleed from the diistal right colon polyp-controlled by application of a resolution clip.  RECOMMENDATIONS:     1.  Hold all NSAIDS for the next 4 weeks. 2.  Await pathology results. 3.  Continue current medications. 4.  Continue surveillance. 5.  High fiber diet with liberal fluid intake. 6.  OP follow-up is advised on a PRN basis.  REPEAT EXAM:      for repeat colonoscopy in 5 years; if the patient has any abnormal GI symptoms in the interim, she/he have been advised to contact the office as soon as possible for  further recommendations.   CPT CODES:     [id number], Colonoscopy with Polypectomy (Snare) [id number], Colonoscopy with Control Bleeding [phone] Colonoscopy with cold biopsies   DIAGNOSIS CODES:     V76.51 Colorectal cancer screening 211.3 Polyps   REFERRED OJ:[id number] Noah Delaine, M.D.  eSigned:  Dr. Edmonia James, MD 03/03/2014 10:27 AM   PATIENT NAME:  Jill Rasmussen MR#: [id number]

## 2014-03-03 NOTE — Anesthesia Preprocedure Evaluation (Addendum)
Anesthesia Evaluation  Patient identified by MRN, date of birth, ID band Patient awake    Reviewed: Allergy & Precautions, H&P , NPO status , Patient's Chart, lab work & pertinent test results  History of Anesthesia Complications (+) DIFFICULT AIRWAY and history of anesthetic complications  Airway Mallampati: II TM Distance: >3 FB Neck ROM: Full  Mouth opening: Limited Mouth Opening  Dental no notable dental hx.    Pulmonary sleep apnea ,  breath sounds clear to auscultation  Pulmonary exam normal       Cardiovascular hypertension, Rhythm:Regular Rate:Normal     Neuro/Psych PSYCHIATRIC DISORDERS Anxiety Depression  Neuromuscular disease    GI/Hepatic Neg liver ROS, hiatal hernia, GERD-  Medicated,  Endo/Other  negative endocrine ROS  Renal/GU negative Renal ROS  negative genitourinary   Musculoskeletal  (+) Arthritis -, Rheumatoid disorders,  Fibromyalgia -, narcotic dependent  Abdominal   Peds negative pediatric ROS (+)  Hematology negative hematology ROS (+)   Anesthesia Other Findings   Reproductive/Obstetrics negative OB ROS                         Anesthesia Physical Anesthesia Plan  ASA: III  Anesthesia Plan: MAC   Post-op Pain Management:    Induction: Intravenous  Airway Management Planned:   Additional Equipment:   Intra-op Plan:   Post-operative Plan:   Informed Consent: I have reviewed the patients History and Physical, chart, labs and discussed the procedure including the risks, benefits and alternatives for the proposed anesthesia with the patient or authorized representative who has indicated his/her understanding and acceptance.   Dental advisory given  Plan Discussed with: CRNA  Anesthesia Plan Comments:        Anesthesia Quick Evaluation

## 2014-03-03 NOTE — Anesthesia Postprocedure Evaluation (Signed)
  Anesthesia Post-op Note  Patient: Jill Rasmussen  Procedure(s) Performed: Procedure(s) (LRB): ESOPHAGOGASTRODUODENOSCOPY (EGD) WITH PROPOFOL (N/A) COLONOSCOPY WITH PROPOFOL (N/A)  Patient Location: PACU  Anesthesia Type: MAC  Level of Consciousness: awake and alert   Airway and Oxygen Therapy: Patient Spontanous Breathing  Post-op Pain: mild  Post-op Assessment: Post-op Vital signs reviewed, Patient's Cardiovascular Status Stable, Respiratory Function Stable, Patent Airway and No signs of Nausea or vomiting  Last Vitals:  Filed Vitals:   03/03/14 0953  BP: 115/51  Pulse:   Temp:   Resp:     Post-op Vital Signs: stable   Complications: No apparent anesthesia complications

## 2014-03-03 NOTE — Transfer of Care (Signed)
Immediate Anesthesia Transfer of Care Note  Patient: Jill Rasmussen  Procedure(s) Performed: Procedure(s): ESOPHAGOGASTRODUODENOSCOPY (EGD) WITH PROPOFOL (N/A) COLONOSCOPY WITH PROPOFOL (N/A)  Patient Location: PACU and Endoscopy Unit  Anesthesia Type:MAC  Level of Consciousness: awake, alert , oriented and patient cooperative  Airway & Oxygen Therapy: Patient Spontanous Breathing and Patient connected to nasal cannula oxygen  Post-op Assessment: Report given to PACU RN and Post -op Vital signs reviewed and stable  Post vital signs: Reviewed and stable  Complications: No apparent anesthesia complications

## 2014-03-03 NOTE — Consult Note (Signed)
Reason for Consult: Colorectal cancer screening. Referring Physician: [name] is an 53 y.o. female.  HPI: 53 year old white female, here for an EGD and a colonoscopy. She has had abnormal weight loss and also has a history of Barrett's esophagus. She is being done in hospital as she has a history of being a difficult intubation. See office notes for details.   Past Medical History  Diagnosis Date  . GERD (gastroesophageal reflux disease)   . Hiatal hernia   . Fibromyalgia   . Rheumatoid arthritis(714.0) age 20  . Anxiety   . Depression   . Genital warts 2005  . Osteoporosis   . Abnormal pap 2005, 2012    2012-ASCUS, + HR HPV  . Hypertension     off all bp meds last 2 years  . Sleep apnea     no cpap used  . Barrett's esophagus   . Complication of anesthesia 10  years ago    trouble getting tube down due to small mouth opening, no letter given to pt   Past Surgical History  Procedure Laterality Date  . Total hip arthroplasty Right 1995  . Total hip arthroplasty Left 1996  . Total shoulder arthroplasty Left 1998  . Cesarean section  1995  . Colposcopy  2005, 2012    2012-CIN 1; ascus pap w/pos HPV  . Right hip revision  2008  . Mandible surgery  1982    tight mouth opening due to scar tissue  . Nasal sinus surgery    . Fusion left thumb      secondary to arthritis   Family History  Problem Relation Age of Onset  . Multiple sclerosis Sister   . Diabetes Maternal Grandmother   . Cervical cancer Maternal Grandmother   . Hypertension Maternal Grandmother   . Heart disease Maternal Grandfather   . Endometriosis Sister   . Depression Mother   . Anxiety disorder Mother   . Osteoporosis Sister    Social History:  reports that she has never smoked. She has never used smokeless tobacco. She reports that she drinks alcohol. She reports that she does not use illicit drugs.  Allergies:  Allergies  Allergen Reactions  . Sulfa Antibiotics Rash    Medications: I have reviewed the patient's current medications.  No results found for this or any previous visit (from the past 48 hour(s)).  No results found.  Review of Systems  Constitutional: Positive for weight loss.  HENT: Negative.   Eyes: Negative.   Respiratory: Negative.   Cardiovascular: Negative.   Gastrointestinal: Positive for heartburn and constipation.  Musculoskeletal: Positive for joint pain and myalgias.  Neurological: Negative.   Endo/Heme/Allergies: Negative.   Psychiatric/Behavioral: Positive for depression. The patient is nervous/anxious.    Blood pressure 135/60, pulse 64, temperature 98.3 F (36.8 C), temperature source Oral, resp. rate 14, height 5\' 1"  (1.549 m), weight 56.7 kg (125 lb), SpO2 100.00%. Physical Exam  Constitutional: She is oriented to person, place, and time. She appears well-developed and well-nourished.  HENT:  Head: Normocephalic and atraumatic.  Eyes: Conjunctivae and EOM are normal. Pupils are equal, round, and reactive to light.  Neck: Normal range of motion. Neck supple.  Cardiovascular: Normal rate and regular rhythm.   Respiratory: Effort normal and breath sounds normal.  GI: Soft. Bowel sounds are normal.  Musculoskeletal: Normal range of motion.  Neurological: She is alert and oriented to person, place, and time.  Skin: Skin is warm and dry.  Psychiatric: She has a normal mood and affect. Her behavior is normal. Judgment and thought content normal.   Assessment/Plan: Barrett's esophagus; abnormal weight loss; proceed with an EGD and a colonoscopy at this time.  Jill Rasmussen 03/03/2014, 8:34 AM

## 2014-03-03 NOTE — Discharge Instructions (Signed)
Gastrointestinal Endoscopy, Care After °Refer to this sheet in the next few weeks. These instructions provide you with information on caring for yourself after your procedure. Your caregiver may also give you more specific instructions. Your treatment has been planned according to current medical practices, but problems sometimes occur. Call your caregiver if you have any problems or questions after your procedure. °HOME CARE INSTRUCTIONS °· If you were given medicine to help you relax (sedative), do not drive, operate machinery, or sign important documents for 24 hours. °· Avoid alcohol and hot or warm beverages for the first 24 hours after the procedure. °· Only take over-the-counter or prescription medicines for pain, discomfort, or fever as directed by your caregiver. You may resume taking your normal medicines unless your caregiver tells you otherwise. Ask your caregiver when you may resume taking medicines that may cause bleeding, such as aspirin, clopidogrel, or warfarin. °· You may return to your normal diet and activities on the day after your procedure, or as directed by your caregiver. Walking may help to reduce any bloated feeling in your abdomen. °· Drink enough fluids to keep your urine clear or pale yellow. °· You may gargle with salt water if you have a sore throat. °SEEK IMMEDIATE MEDICAL CARE IF: °· You have severe nausea or vomiting. °· You have severe abdominal pain, abdominal cramps that last longer than 6 hours, or abdominal swelling (distention). °· You have severe shoulder or back pain. °· You have trouble swallowing. °· You have shortness of breath, your breathing is shallow, or you are breathing faster than normal. °· You have a fever or a rapid heartbeat. °· You vomit blood or material that looks like coffee grounds. °· You have bloody, black, or tarry stools. °MAKE SURE YOU: °· Understand these instructions. °· Will watch your condition. °· Will get help right away if you are not doing  well or get worse. °Document Released: 02/06/2004 Document Revised: 11/08/2013 Document Reviewed: 09/24/2011 °[employer]® Patient Information ©2015 [employer]. This information is not intended to replace advice given to you by your health care provider. Make sure you discuss any questions you have with your health care provider. ° °

## 2014-03-04 ENCOUNTER — Encounter (HOSPITAL_COMMUNITY): Payer: Self-pay | Admitting: Gastroenterology

## 2014-05-09 ENCOUNTER — Encounter (HOSPITAL_COMMUNITY): Payer: Self-pay | Admitting: Gastroenterology

## 2015-05-11 ENCOUNTER — Other Ambulatory Visit: Payer: Self-pay | Admitting: Pain Medicine

## 2015-05-11 DIAGNOSIS — M542 Cervicalgia: Secondary | ICD-10-CM

## 2015-05-15 ENCOUNTER — Ambulatory Visit
Admission: RE | Admit: 2015-05-15 | Discharge: 2015-05-15 | Disposition: A | Discharge status: Home / Self Care | Payer: 59 | Source: Ambulatory Visit | Attending: Pain Medicine | Admitting: Pain Medicine

## 2015-05-15 DIAGNOSIS — M542 Cervicalgia: Secondary | ICD-10-CM

## 2015-06-26 ENCOUNTER — Encounter: Payer: 59 | Admitting: Obstetrics & Gynecology

## 2015-08-15 ENCOUNTER — Encounter: Payer: 59 | Admitting: Physician Assistant

## 2015-10-03 ENCOUNTER — Encounter: Payer: Self-pay | Admitting: Obstetrics & Gynecology

## 2017-11-21 ENCOUNTER — Other Ambulatory Visit: Payer: Self-pay | Admitting: Obstetrics and Gynecology

## 2017-11-21 DIAGNOSIS — Z1231 Encounter for screening mammogram for malignant neoplasm of breast: Secondary | ICD-10-CM

## 2017-12-18 ENCOUNTER — Ambulatory Visit: Payer: Self-pay

## 2018-11-06 DEATH — deceased

## 2020-11-05 DEATH — deceased
